# Patient Record
Sex: Female | Born: 2000 | Race: Black or African American | Hispanic: No | Marital: Single | Smoking: Never smoker
Health system: Southern US, Community
[De-identification: ages and names within clinical notes are randomized; demographics above are authoritative.]

## PROBLEM LIST (undated history)

## (undated) DIAGNOSIS — K219 Gastro-esophageal reflux disease without esophagitis: Secondary | ICD-10-CM

## (undated) DIAGNOSIS — N809 Endometriosis, unspecified: Secondary | ICD-10-CM

## (undated) HISTORY — PX: TONSILLECTOMY: SUR1361

---
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
## 2021-05-10 ENCOUNTER — Other Ambulatory Visit: Payer: Self-pay | Admitting: Physician Assistant

## 2021-05-10 DIAGNOSIS — K219 Gastro-esophageal reflux disease without esophagitis: Secondary | ICD-10-CM

## 2021-05-30 ENCOUNTER — Other Ambulatory Visit: Payer: Medicaid Other

## 2021-06-10 ENCOUNTER — Other Ambulatory Visit: Payer: Medicaid Other

## 2021-07-23 ENCOUNTER — Ambulatory Visit: Payer: Self-pay

## 2021-07-23 ENCOUNTER — Encounter (HOSPITAL_BASED_OUTPATIENT_CLINIC_OR_DEPARTMENT_OTHER): Payer: Self-pay | Admitting: *Deleted

## 2021-07-23 ENCOUNTER — Emergency Department (HOSPITAL_BASED_OUTPATIENT_CLINIC_OR_DEPARTMENT_OTHER)
Admission: EM | Admit: 2021-07-23 | Discharge: 2021-07-23 | Disposition: A | Discharge status: Home / Self Care | Payer: Managed Care, Other (non HMO) | Attending: Emergency Medicine | Admitting: Emergency Medicine

## 2021-07-23 ENCOUNTER — Other Ambulatory Visit: Payer: Self-pay

## 2021-07-23 DIAGNOSIS — R110 Nausea: Secondary | ICD-10-CM | POA: Diagnosis not present

## 2021-07-23 DIAGNOSIS — K219 Gastro-esophageal reflux disease without esophagitis: Secondary | ICD-10-CM | POA: Diagnosis not present

## 2021-07-23 DIAGNOSIS — R109 Unspecified abdominal pain: Secondary | ICD-10-CM | POA: Insufficient documentation

## 2021-07-23 DIAGNOSIS — R197 Diarrhea, unspecified: Secondary | ICD-10-CM | POA: Diagnosis present

## 2021-07-23 HISTORY — DX: Gastro-esophageal reflux disease without esophagitis: K21.9

## 2021-07-23 LAB — URINALYSIS, ROUTINE W REFLEX MICROSCOPIC
Bilirubin Urine: NEGATIVE
Glucose, UA: NEGATIVE mg/dL
Hgb urine dipstick: NEGATIVE
Ketones, ur: NEGATIVE mg/dL
Leukocytes,Ua: NEGATIVE
Nitrite: NEGATIVE
Protein, ur: NEGATIVE mg/dL
Specific Gravity, Urine: 1.02 (ref 1.005–1.030)
pH: 7 (ref 5.0–8.0)

## 2021-07-23 LAB — CBC
HCT: 40 % (ref 36.0–46.0)
Hemoglobin: 13.2 g/dL (ref 12.0–15.0)
MCH: 28.9 pg (ref 26.0–34.0)
MCHC: 33 g/dL (ref 30.0–36.0)
MCV: 87.5 fL (ref 80.0–100.0)
Platelets: 330 10*3/uL (ref 150–400)
RBC: 4.57 MIL/uL (ref 3.87–5.11)
RDW: 12.9 % (ref 11.5–15.5)
WBC: 8.9 10*3/uL (ref 4.0–10.5)
nRBC: 0 % (ref 0.0–0.2)

## 2021-07-23 LAB — COMPREHENSIVE METABOLIC PANEL
ALT: 10 U/L (ref 0–44)
AST: 14 U/L — ABNORMAL LOW (ref 15–41)
Albumin: 3.9 g/dL (ref 3.5–5.0)
Alkaline Phosphatase: 49 U/L (ref 38–126)
Anion gap: 10 (ref 5–15)
BUN: 8 mg/dL (ref 6–20)
CO2: 24 mmol/L (ref 22–32)
Calcium: 9.3 mg/dL (ref 8.9–10.3)
Chloride: 104 mmol/L (ref 98–111)
Creatinine, Ser: 0.74 mg/dL (ref 0.44–1.00)
GFR, Estimated: >60 mL/min (ref >60)
Glucose, Bld: 94 mg/dL (ref 70–99)
Potassium: 3.8 mmol/L (ref 3.5–5.1)
Sodium: 138 mmol/L (ref 135–145)
Total Bilirubin: 0.5 mg/dL (ref 0.3–1.2)
Total Protein: 7.4 g/dL (ref 6.5–8.1)

## 2021-07-23 LAB — PREGNANCY, URINE: Preg Test, Ur: NEGATIVE

## 2021-07-23 LAB — LIPASE, BLOOD: Lipase: 25 U/L (ref 11–51)

## 2021-07-23 MED ORDER — ONDANSETRON 4 MG PO TBDP: 4.0000 mg | ORAL_TABLET | Freq: Three times a day (TID) | ORAL | 0 refills | Status: DC | PRN

## 2021-07-23 MED ORDER — DICYCLOMINE HCL 20 MG PO TABS: 20.0000 mg | ORAL_TABLET | Freq: Three times a day (TID) | ORAL | 0 refills | Status: DC | PRN

## 2021-07-23 NOTE — Discharge Instructions (Addendum)
Please keep your GI appointment tomorrow.  I am calling in 2 medications to help with your symptoms.  Return to the emergency department any new or suddenly worsening symptoms such as severe pain, fevers, vomiting blood, etc.

## 2021-07-23 NOTE — ED Notes (Signed)
Patient reports intermittent abdominal pain, nausea and dizziness since starting Linzess 2 weeks ago, patient has a follow up with GI doctor tomorrow.

## 2021-07-23 NOTE — ED Provider Notes (Signed)
Emergency Department Provider Note   I have reviewed the triage vital signs and the nursing notes.   HISTORY  Chief Complaint Diarrhea   HPI Julie Hobbs is a 20 y.o. female presents to the ED with abdominal pain and diarrhea with nausea. Symptoms have been intermittent in the last year.  Patient has an appointment with her GI doctor tomorrow.  She states she was started on Linzess 2 weeks ago as a trial for possible IBS symptoms although does not carry that diagnosis.  She states that the medicine causes nausea.  She did have some bowel movements on the medication but has since developed some diarrhea.  Symptoms do seem worse after taking the medicines.  She states that while her symptoms have worsened in the last 2 weeks she verbalizes some frustration over the symptoms over the past year.  She does not have a firm diagnosis regarding this.  No fevers or chills.  No radiation of the symptoms or modifying factors.  Abdominal pain is diffuse and sharp with some pressure sensation at times.  Past Medical History:  Diagnosis Date   GERD (gastroesophageal reflux disease)     There are no problems to display for this patient.   Past Surgical History:  Procedure Laterality Date   TONSILLECTOMY      Allergies Patient has no known allergies.  No family history on file.  Social History Social History   Tobacco Use   Smoking status: Never   Smokeless tobacco: Never  Vaping Use   Vaping Use: Never used  Substance Use Topics   Alcohol use: No   Drug use: No    Review of Systems  Constitutional: No fever/chills Eyes: No visual changes. ENT: No sore throat. Cardiovascular: Denies chest pain. Respiratory: Denies shortness of breath. Gastrointestinal: Positive abdominal pain. Positive nausea, no vomiting. Positive diarrhea.  No constipation. Genitourinary: Negative for dysuria. Musculoskeletal: Negative for back pain. Skin: Negative for rash. Neurological: Negative for  headaches, focal weakness or numbness.  10-point ROS otherwise negative.  ____________________________________________   PHYSICAL EXAM:  VITAL SIGNS: ED Triage Vitals  Enc Vitals Group     BP 07/23/21 1425 134/90     Pulse Rate 07/23/21 1425 81     Resp 07/23/21 1425 18     Temp 07/23/21 1425 99.6 F (37.6 C)     Temp Source 07/23/21 1425 Oral     SpO2 07/23/21 1425 100 %     Weight 07/23/21 1426 183 lb (83 kg)     Height 07/23/21 1426 5' (1.524 m)   Constitutional: Alert and oriented. Well appearing and in no acute distress. Eyes: Conjunctivae are normal.  Head: Atraumatic. Nose: No congestion/rhinnorhea. Mouth/Throat: Mucous membranes are moist.   Neck: No stridor.  Cardiovascular: Normal rate, regular rhythm. Good peripheral circulation. Grossly normal heart sounds.   Respiratory: Normal respiratory effort.  No retractions. Lungs CTAB. Gastrointestinal: Soft and nontender. No distention.  Musculoskeletal: No lower extremity tenderness nor edema. No gross deformities of extremities. Neurologic:  Normal speech and language. No gross focal neurologic deficits are appreciated.  Skin:  Skin is warm, dry and intact. No rash noted.   ____________________________________________   LABS (all labs ordered are listed, but only abnormal results are displayed)  Labs Reviewed  COMPREHENSIVE METABOLIC PANEL - Abnormal; Notable for the following components:      Result Value   AST 14 (*)    All other components within normal limits  LIPASE, BLOOD  CBC  URINALYSIS, ROUTINE W  REFLEX MICROSCOPIC  PREGNANCY, URINE   ____________________________________________   PROCEDURES  Procedure(s) performed:   Procedures  None  ____________________________________________   INITIAL IMPRESSION / ASSESSMENT AND PLAN / ED COURSE  Pertinent labs & imaging results that were available during my care of the patient were reviewed by me and considered in my medical decision making (see  chart for details).   Patient presents to the emergency department with abdominal pain and nausea with diarrhea.  Symptoms ongoing for the past year.  Labs reviewed with no acute findings.  Pregnancy is negative.  Patient sees her GI team tomorrow.  Plan for Zofran and Bentyl for symptom management and will discuss further testing on an outpatient basis with her GI team in the morning.  I do not see an indication for emergent imaging in the emergency department.  She is not having focal tenderness in the right upper quadrant to suspect gallbladder disease.  No right lower quadrant tenderness to suspect acute appendicitis.   ____________________________________________  FINAL CLINICAL IMPRESSION(S) / ED DIAGNOSES  Final diagnoses:  Nausea  Diarrhea, unspecified type    NEW OUTPATIENT MEDICATIONS STARTED DURING THIS VISIT:  New Prescriptions   DICYCLOMINE (BENTYL) 20 MG TABLET    Take 1 tablet (20 mg total) by mouth 3 (three) times daily as needed for spasms.   ONDANSETRON (ZOFRAN ODT) 4 MG DISINTEGRATING TABLET    Take 1 tablet (4 mg total) by mouth every 8 (eight) hours as needed.    Note:  This document was prepared using Dragon voice recognition software and may include unintentional dictation errors.  Alona Bene, MD, [facility] Emergency Medicine    Abel Ra, Arlyss Repress, MD 07/23/21 [phone]

## 2021-07-23 NOTE — ED Triage Notes (Signed)
Diarrhea and nausea. She has been taking Linzess everyday for 2 weeks.

## 2021-07-23 NOTE — Telephone Encounter (Signed)
Patient called to speak to someone about a reaction to Linzess, as the agent was connecting to NT, the patient hung up. I called the patient back, left a VM to return the call if she would like to speak to a triage nurse about her symptoms.

## 2021-07-24 ENCOUNTER — Other Ambulatory Visit: Payer: Self-pay | Admitting: Physician Assistant

## 2021-07-24 DIAGNOSIS — K2100 Gastro-esophageal reflux disease with esophagitis, without bleeding: Secondary | ICD-10-CM

## 2021-07-24 DIAGNOSIS — K5900 Constipation, unspecified: Secondary | ICD-10-CM

## 2021-07-24 DIAGNOSIS — R1030 Lower abdominal pain, unspecified: Secondary | ICD-10-CM

## 2021-08-12 ENCOUNTER — Ambulatory Visit
Admission: RE | Admit: 2021-08-12 | Discharge: 2021-08-12 | Disposition: A | Discharge status: Home / Self Care | Payer: Managed Care, Other (non HMO) | Source: Ambulatory Visit | Attending: Physician Assistant | Admitting: Physician Assistant

## 2021-08-12 ENCOUNTER — Other Ambulatory Visit: Payer: Self-pay

## 2021-08-12 DIAGNOSIS — K5900 Constipation, unspecified: Secondary | ICD-10-CM

## 2021-08-12 DIAGNOSIS — R1030 Lower abdominal pain, unspecified: Secondary | ICD-10-CM

## 2021-08-12 DIAGNOSIS — K2100 Gastro-esophageal reflux disease with esophagitis, without bleeding: Secondary | ICD-10-CM

## 2021-08-12 MED ORDER — IOPAMIDOL (ISOVUE-300) INJECTION 61%
100.0000 mL | Freq: Once | INTRAVENOUS | Status: AC | PRN
  Administered 2021-08-12: 100 mL via INTRAVENOUS

## 2022-07-13 ENCOUNTER — Encounter (HOSPITAL_COMMUNITY): Payer: Self-pay

## 2022-07-13 ENCOUNTER — Emergency Department (HOSPITAL_COMMUNITY): Payer: Medicaid Other

## 2022-07-13 ENCOUNTER — Emergency Department (HOSPITAL_COMMUNITY)
Admission: EM | Admit: 2022-07-13 | Discharge: 2022-07-13 | Disposition: A | Discharge status: Home / Self Care | Payer: Medicaid Other | Attending: Emergency Medicine | Admitting: Emergency Medicine

## 2022-07-13 ENCOUNTER — Other Ambulatory Visit: Payer: Self-pay

## 2022-07-13 DIAGNOSIS — S93402A Sprain of unspecified ligament of left ankle, initial encounter: Secondary | ICD-10-CM | POA: Insufficient documentation

## 2022-07-13 DIAGNOSIS — S99912A Unspecified injury of left ankle, initial encounter: Secondary | ICD-10-CM | POA: Diagnosis present

## 2022-07-13 DIAGNOSIS — W1781XA Fall down embankment (hill), initial encounter: Secondary | ICD-10-CM | POA: Diagnosis not present

## 2022-07-13 NOTE — ED Provider Notes (Signed)
[facility]-EMERGENCY DEPT Provider Note   CSN: [id number] Arrival date & time: 07/13/22  1402     History  Chief Complaint  Patient presents with   Ankle Injury    Julie Hobbs is a 21 y.o. female noncontributory past medical history presents with concern for left ankle injury after twisting it and rolling down a hill.  Patient reports that she is not sure whether she fell onto the inside of the outside of her ankle.  She reports that she can bear weight with some difficulty but it is very difficult, she reports that she feels pain shooting up from the ankle to the mid leg.  She reports some tingling without overt numbness.  She does not take any blood thinners.  She denies hitting her head, loss consciousness.   Ankle Injury       Home Medications Prior to Admission medications   Medication Sig Start Date End Date Taking? Authorizing Provider  albuterol (PROVENTIL HFA;VENTOLIN HFA) 108 (90 Base) MCG/ACT inhaler Inhale 1-2 puffs into the lungs every 6 (six) hours as needed for wheezing or shortness of breath. 10/28/17   Ward, Layla Maw, DO  dicyclomine (BENTYL) 20 MG tablet Take 1 tablet (20 mg total) by mouth 3 (three) times daily as needed for spasms. 07/23/21   Long, Arlyss Repress, MD  linaclotide Karlene Einstein) 290 MCG CAPS capsule Linzess 290 mcg capsule  Take 1 capsule every day by oral route.    [provider]  ondansetron (ZOFRAN ODT) 4 MG disintegrating tablet Take 1 tablet (4 mg total) by mouth every 8 (eight) hours as needed. 07/23/21   Long, Arlyss Repress, MD  pantoprazole (PROTONIX) 40 MG tablet pantoprazole 40 mg tablet,delayed release    [provider]      Allergies    Patient has no known allergies.    Review of Systems   Review of Systems  All other systems reviewed and are negative.   Physical Exam Updated Vital Signs BP 129/83   Pulse 64   Temp 98.2 F (36.8 C) (Oral)   Resp 16   SpO2 100%  Physical Exam Vitals and  nursing note reviewed.  Constitutional:      General: She is not in acute distress.    Appearance: Normal appearance.  HENT:     Head: Normocephalic and atraumatic.  Eyes:     General:        Right eye: No discharge.        Left eye: No discharge.  Cardiovascular:     Rate and Rhythm: Normal rate and regular rhythm.     Pulses: Normal pulses.  Pulmonary:     Effort: Pulmonary effort is normal. No respiratory distress.  Musculoskeletal:        General: No deformity.     Comments: Patient with some fairly significant swelling of the left ankle.  She is tender to palpation on the medial and lateral malleolus.  She has intact range of motion both passively and actively to dorsiflexion, plantarflexion.  She does have some tenderness extending up into the tibia and fibula without step-off, deformity.  She can bear weight with some difficulty.  Skin:    General: Skin is warm and dry.     Capillary Refill: Capillary refill takes less than 2 seconds.  Neurological:     Mental Status: She is alert and oriented to person, place, and time.  Psychiatric:        Mood and Affect: Mood normal.  Behavior: Behavior normal.     ED Results / Procedures / Treatments   Labs (all labs ordered are listed, but only abnormal results are displayed) Labs Reviewed - No data to display  EKG None  Radiology DG Ankle Complete Left  Result Date: 07/13/2022 CLINICAL DATA:  Left ankle injury, anterior and posterior pain, numbness EXAM: LEFT ANKLE COMPLETE - 3+ VIEW COMPARISON:  10/03/2017 FINDINGS: Frontal, oblique, and lateral views of the left ankle are obtained. No acute fracture, subluxation, or dislocation. Joint spaces are well preserved. Soft tissues are unremarkable. IMPRESSION: 1. Unremarkable left ankle. Electronically Signed   By: Sharlet Salina M.D.   On: 07/13/2022 15:25    Procedures Procedures    Medications Ordered in ED Medications - No data to display  ED Course/ Medical  Decision Making/ A&P                           Medical Decision Making Amount and/or Complexity of Data Reviewed Radiology: ordered.   This an overall well-appearing 21 year old female who presents with concern for left ankle injury.  On my exam her symptoms seem most consistent with left ankle sprain.  The emergent differential diagnosis includes fracture, dislocation, high ankle sprain, other musculoskeletal injury versus other.  I independently interpreted imaging including plain film radiograph of the left which shows no acute fracture, dislocation. I agree with the radiologist interpretation.  Patient with neurovascularly intact left lower extremity, symptoms do seem most consistent with ankle sprain with some other bruising and tissue injury from her fall.  We will provide her with ASO brace, crutches, encouraged ibuprofen, Tylenol, rest, ice, compression, elevation as well as orthopedic follow-up.  Rehab exercises given.  Patient discharged in stable condition at this time. Final Clinical Impression(s) / ED Diagnoses Final diagnoses:  Sprain of left ankle, unspecified ligament, initial encounter    Rx / DC Orders ED Discharge Orders     None         Julie Floss, PA-C 07/13/22 1543    Virgina Norfolk, DO 07/13/22 1723

## 2022-07-13 NOTE — Discharge Instructions (Addendum)
Please use Tylenol or ibuprofen for pain.  You may use 600 mg ibuprofen every 6 hours or 1000 mg of Tylenol every 6 hours.  You may choose to alternate between the 2.  This would be most effective.  Not to exceed 4 g of Tylenol within 24 hours.  Not to exceed 3200 mg ibuprofen 24 hours.  Recommend rest, ice, compression, elevation in addition to the above.  You can bear weight on the ankle as tolerated, please use the rehab exercises provided above. Please follow up with orthopedics if no significant improvement in 1-2 weeks.

## 2022-07-13 NOTE — ED Triage Notes (Signed)
Pt reports she fell down a hill and injured her left ankle. Pt now endorses pain and swelling to ankle.

## 2022-07-13 NOTE — ED Notes (Signed)
Ortho called 

## 2022-10-29 ENCOUNTER — Ambulatory Visit
Admission: EM | Admit: 2022-10-29 | Discharge: 2022-10-29 | Disposition: A | Discharge status: Home / Self Care | Payer: Medicaid Other | Attending: Urgent Care | Admitting: Urgent Care

## 2022-10-29 ENCOUNTER — Encounter: Payer: Self-pay | Admitting: Emergency Medicine

## 2022-10-29 DIAGNOSIS — A09 Infectious gastroenteritis and colitis, unspecified: Secondary | ICD-10-CM | POA: Insufficient documentation

## 2022-10-29 DIAGNOSIS — R1084 Generalized abdominal pain: Secondary | ICD-10-CM | POA: Insufficient documentation

## 2022-10-29 MED ORDER — ONDANSETRON 8 MG PO TBDP: 8.0000 mg | ORAL_TABLET | Freq: Three times a day (TID) | ORAL | 0 refills | Status: DC | PRN

## 2022-10-29 MED ORDER — LOPERAMIDE HCL 2 MG PO CAPS: 2.0000 mg | ORAL_CAPSULE | Freq: Two times a day (BID) | ORAL | 0 refills | Status: AC | PRN

## 2022-10-29 NOTE — Discharge Instructions (Addendum)

## 2022-10-29 NOTE — ED Provider Notes (Signed)
[facility]  Note:  This document was prepared using Systems analyst and may include unintentional dictation errors.  MRN: [id number] DOB: January 19, 2001  Subjective:   Julie Hobbs is a 21 y.o. female presenting for 3-day history of acute onset nausea, vomiting, dizziness, diarrhea, fevers and headaches.  Patient had many bouts of vomiting initially and has had more diarrhea recently.  Hematemesis, bloody stools.  She believes that this is related to finding a decomposing rash at the bottom of caramel syrup that she was using at work.  She has been using that in her coffee.  Denies history of GI disorders.  No recent hospitalizations or long distance travel, antibiotic use.  No current facility-administered medications for this encounter.  Current Outpatient Medications:    albuterol (PROVENTIL HFA;VENTOLIN HFA) 108 (90 Base) MCG/ACT inhaler, Inhale 1-2 puffs into the lungs every 6 (six) hours as needed for wheezing or shortness of breath., Disp: 1 Inhaler, Rfl: 0   dicyclomine (BENTYL) 20 MG tablet, Take 1 tablet (20 mg total) by mouth 3 (three) times daily as needed for spasms., Disp: 20 tablet, Rfl: 0   linaclotide (LINZESS) 290 MCG CAPS capsule, Linzess 290 mcg capsule  Take 1 capsule every day by oral route., Disp: , Rfl:    ondansetron (ZOFRAN ODT) 4 MG disintegrating tablet, Take 1 tablet (4 mg total) by mouth every 8 (eight) hours as needed., Disp: 20 tablet, Rfl: 0   pantoprazole (PROTONIX) 40 MG tablet, pantoprazole 40 mg tablet,delayed release, Disp: , Rfl:    No Known Allergies  Past Medical History:  Diagnosis Date   GERD (gastroesophageal reflux disease)      Past Surgical History:  Procedure Laterality Date   TONSILLECTOMY      History reviewed. No pertinent family history.  Social History   Tobacco Use   Smoking status: Never   Smokeless tobacco: Never  Vaping Use   Vaping Use: Never used  Substance Use Topics    Alcohol use: No   Drug use: No    ROS   Objective:   Vitals: BP (!) 137/104 (BP Location: Left Arm)   Pulse 78   Temp 97.8 F (36.6 C) (Oral)   Resp 16   SpO2 98%   Physical Exam Constitutional:      General: She is not in acute distress.    Appearance: Normal appearance. She is well-developed. She is not ill-appearing, toxic-appearing or diaphoretic.  HENT:     Head: Normocephalic and atraumatic.     Nose: Nose normal.     Mouth/Throat:     Mouth: Mucous membranes are moist.  Eyes:     General: No scleral icterus.       Right eye: No discharge.        Left eye: No discharge.     Extraocular Movements: Extraocular movements intact.     Conjunctiva/sclera: Conjunctivae normal.  Cardiovascular:     Rate and Rhythm: Normal rate.  Pulmonary:     Effort: Pulmonary effort is normal.  Abdominal:     General: Bowel sounds are normal. There is no distension.     Palpations: Abdomen is soft. There is no mass.     Tenderness: There is abdominal tenderness (generalized throughout worse over the lower side). There is no right CVA tenderness, left CVA tenderness, guarding or rebound.  Skin:    General: Skin is warm and dry.  Neurological:     General: No focal deficit present.  Mental Status: She is alert and oriented to person, place, and time.  Psychiatric:        Mood and Affect: Mood normal.        Behavior: Behavior normal.        Thought Content: Thought content normal.        Judgment: Judgment normal.       Assessment and Plan :   PDMP not reviewed this encounter.  1. Infectious gastroenteritis and colitis   2. Generalized abdominal pain     Will manage for suspected infectious gastroenteritis and colitis with supportive care.  Recommended patient hydrate well, eat light meals and maintain electrolytes.  Will use Zofran and Imodium for nausea, vomiting and diarrhea.  Culture pending, will treat as appropriate based off of those results.  Counseled patient  on potential for adverse effects with medications prescribed/recommended today, ER and return-to-clinic precautions discussed, patient verbalized understanding.    Jaynee Eagles, PA-C 10/29/22 1734

## 2022-10-29 NOTE — ED Triage Notes (Signed)
Nausea, vomiting, dizziness, diarrhea, fever, headache. Found out recently at her job that a rat died in the caramel syrup, and she had been putting that in her coffee.

## 2022-11-03 ENCOUNTER — Telehealth (HOSPITAL_COMMUNITY): Payer: Self-pay | Admitting: Emergency Medicine

## 2022-11-03 MED ORDER — CIPROFLOXACIN HCL 500 MG PO TABS: 500.0000 mg | ORAL_TABLET | Freq: Two times a day (BID) | ORAL | 0 refills | Status: AC

## 2022-11-04 LAB — GASTROINTESTINAL PANEL BY PCR, STOOL (REPLACES STOOL CULTURE)

## 2022-12-04 ENCOUNTER — Ambulatory Visit (INDEPENDENT_AMBULATORY_CARE_PROVIDER_SITE_OTHER): Payer: Medicaid Other

## 2022-12-04 ENCOUNTER — Ambulatory Visit
Admission: RE | Admit: 2022-12-04 | Discharge: 2022-12-04 | Disposition: A | Discharge status: Home / Self Care | Payer: Medicaid Other | Source: Ambulatory Visit | Attending: Physician Assistant | Admitting: Physician Assistant

## 2022-12-04 VITALS — BP 120/77 | HR 70 | Temp 99.0°F | Resp 18 | Ht 60.0 in | Wt 180.0 lb

## 2022-12-04 DIAGNOSIS — R109 Unspecified abdominal pain: Secondary | ICD-10-CM

## 2022-12-04 DIAGNOSIS — R110 Nausea: Secondary | ICD-10-CM

## 2022-12-04 HISTORY — DX: Endometriosis, unspecified: N80.9

## 2022-12-04 LAB — POCT URINALYSIS DIP (MANUAL ENTRY)
Bilirubin, UA: NEGATIVE
Blood, UA: NEGATIVE
Glucose, UA: NEGATIVE mg/dL
Ketones, POC UA: NEGATIVE mg/dL
Nitrite, UA: NEGATIVE
Spec Grav, UA: >=1.03 — AB (ref 1.010–1.025)
Urobilinogen, UA: 0.2 E.U./dL
pH, UA: 6 (ref 5.0–8.0)

## 2022-12-04 LAB — POCT URINE PREGNANCY: Preg Test, Ur: NEGATIVE

## 2022-12-04 MED ORDER — PROMETHAZINE HCL 12.5 MG PO TABS: 12.5000 mg | ORAL_TABLET | Freq: Three times a day (TID) | ORAL | 0 refills | Status: AC | PRN

## 2022-12-04 MED ORDER — DICYCLOMINE HCL 20 MG PO TABS: 20.0000 mg | ORAL_TABLET | Freq: Three times a day (TID) | ORAL | 0 refills | Status: AC | PRN

## 2022-12-04 NOTE — ED Provider Notes (Signed)
[facility]    CSN: [id number] Arrival date & time: 12/04/22  4098      History   Chief Complaint Chief Complaint  Patient presents with   Chills    Woke up this morning with chills and body soreness. I get viruses often so I'm sure it's just that - Entered by patient    HPI Julie SHACKETT is a 21 y.o. female.   Patient presents today with a several hour history of left-sided abdominal pain and nausea.  Reports that she has had intermittent abdominal symptoms in the past but denies formal diagnosis of irritable bowel, Crohn's, GERD, peptic ulcer disease.  She has not tried any over-the-counter medication for symptom management.  She reports pain is minimal but is worsen with palpation.  Denies previous abdominal surgery.  She does not take any NSAIDs on a regular basis; uses ibuprofen as needed for acute pain.  Denies regular alcohol consumption.  Denies any recent medication changes and does not take any GLP-1 agonist.  She reports having normal bowel movements with last bowel movement yesterday which she describes as normal.  She denies any recent dietary or medication changes.  Denies any known sick contacts.    Past Medical History:  Diagnosis Date   Endometriosis    GERD (gastroesophageal reflux disease)     There are no problems to display for this patient.   Past Surgical History:  Procedure Laterality Date   TONSILLECTOMY      OB History   No obstetric history on file.      Home Medications    Prior to Admission medications   Medication Sig Start Date End Date Taking? Authorizing Provider  albuterol (PROVENTIL HFA;VENTOLIN HFA) 108 (90 Base) MCG/ACT inhaler Inhale 1-2 puffs into the lungs every 6 (six) hours as needed for wheezing or shortness of breath. 10/28/17  Yes Ward, Layla Maw, DO  loperamide (IMODIUM) 2 MG capsule Take 1 capsule (2 mg total) by mouth 2 (two) times daily as needed for diarrhea or loose stools. 10/29/22  Yes Wallis Bamberg, PA-C   promethazine (PHENERGAN) 12.5 MG tablet Take 1 tablet (12.5 mg total) by mouth every 8 (eight) hours as needed for nausea or vomiting. 12/04/22  Yes Rylei Codispoti K, PA-C  dicyclomine (BENTYL) 20 MG tablet Take 1 tablet (20 mg total) by mouth 3 (three) times daily as needed for spasms. 12/04/22   Aminat Shelburne, Noberto Retort, PA-C  linaclotide (LINZESS) 290 MCG CAPS capsule Linzess 290 mcg capsule  Take 1 capsule every day by oral route.    [provider]  pantoprazole (PROTONIX) 40 MG tablet pantoprazole 40 mg tablet,delayed release    [provider]    Family History History reviewed. No pertinent family history.  Social History Social History   Tobacco Use   Smoking status: Never   Smokeless tobacco: Never  Vaping Use   Vaping Use: Never used  Substance Use Topics   Alcohol use: No   Drug use: No     Allergies   Patient has no known allergies.   Review of Systems Review of Systems  Constitutional:  Positive for activity change. Negative for appetite change, fatigue and fever.  HENT:  Negative for congestion, sinus pressure, sneezing and sore throat.   Respiratory:  Negative for cough and shortness of breath.   Cardiovascular:  Negative for chest pain.  Gastrointestinal:  Positive for abdominal pain, nausea and vomiting. Negative for blood in stool, constipation and diarrhea.  Genitourinary:  Negative  for dysuria, frequency, urgency, vaginal bleeding, vaginal discharge and vaginal pain.  Neurological:  Negative for dizziness, light-headedness and headaches.     Physical Exam Triage Vital Signs ED Triage Vitals  Enc Vitals Group     BP 12/04/22 1003 120/77     Pulse Rate 12/04/22 1003 70     Resp 12/04/22 1003 18     Temp 12/04/22 1003 99 F (37.2 C)     Temp Source 12/04/22 1003 Oral     SpO2 12/04/22 1003 99 %     Weight 12/04/22 1005 180 lb (81.6 kg)     Height 12/04/22 1005 5' (1.524 m)     Head Circumference --      Peak Flow --      Pain Score  12/04/22 1005 0     Pain Loc --      Pain Edu? --      Excl. in GC? --    No data found.  Updated Vital Signs BP 120/77 (BP Location: Left Arm)   Pulse 70   Temp 99 F (37.2 C) (Oral)   Resp 18   Ht 5' (1.524 m)   Wt 180 lb (81.6 kg)   LMP 11/20/2022   SpO2 99%   BMI 35.15 kg/m   Visual Acuity Right Eye Distance:   Left Eye Distance:   Bilateral Distance:    Right Eye Near:   Left Eye Near:    Bilateral Near:     Physical Exam Vitals reviewed.  Constitutional:      General: She is awake. She is not in acute distress.    Appearance: Normal appearance. She is well-developed. She is not ill-appearing.     Comments: Very pleasant female appears stated age in no acute distress sitting comfortably in exam room  HENT:     Head: Normocephalic and atraumatic.  Cardiovascular:     Rate and Rhythm: Normal rate and regular rhythm.     Heart sounds: Normal heart sounds, S1 normal and S2 normal. No murmur heard. Pulmonary:     Effort: Pulmonary effort is normal.     Breath sounds: Normal breath sounds. No wheezing, rhonchi or rales.     Comments: Clear to auscultation bilaterally Abdominal:     General: Bowel sounds are normal.     Palpations: Abdomen is soft.     Tenderness: There is abdominal tenderness in the left upper quadrant and left lower quadrant. There is no right CVA tenderness, left CVA tenderness, guarding or rebound. Negative signs include Murphy's sign, Rovsing's sign, McBurney's sign and psoas sign.     Comments: Tenderness palpation over left abdomen.  No evidence of acute abdomen on physical exam.  Psychiatric:        Behavior: Behavior is cooperative.      UC Treatments / Results  Labs (all labs ordered are listed, but only abnormal results are displayed) Labs Reviewed  POCT URINALYSIS DIP (MANUAL ENTRY) - Abnormal; Notable for the following components:      Result Value   Color, UA other (*)    Spec Grav, UA >=1.030 (*)    Protein Ur, POC trace (*)     Leukocytes, UA Small (1+) (*)    All other components within normal limits  URINE CULTURE  POCT URINE PREGNANCY    EKG   Radiology DG Abdomen 1 View  Result Date: 12/04/2022 CLINICAL DATA:  Abdominal pain, nausea EXAM: ABDOMEN - 1 VIEW COMPARISON:  Julie Hobbs Available. FINDINGS: Bowel gas pattern  is nonspecific. No abnormal masses or calcifications are noted. Kidneys are partly obscured by bowel contents. Small calcific densities in both sides of pelvis may suggest phleboliths. If there is clinical suspicion for ureteric calculus, follow-up CT may be considered. IMPRESSION: Nonspecific bowel gas pattern. There are few small calcific densities in both sides of pelvis which may suggest vascular calcifications. If there is clinical suspicion for ureteric calculus, follow-up noncontrast CT may be considered. Electronically Signed   By: Ernie Avena M.D.   On: 12/04/2022 11:14    Procedures Procedures (including critical care time)  Medications Ordered in UC Medications - No data to display  Initial Impression / Assessment and Plan / UC Course  I have reviewed the triage vital signs and the nursing notes.  Pertinent labs & imaging results that were available during my care of the patient were reviewed by me and considered in my medical decision making (see chart for details).     Patient is well-appearing, afebrile, nontoxic, nontachycardic.  Vital signs and physical exam reassuring today; no indication for emergent evaluation or imaging.  Urine pregnancy was negative.  UA showed concentrated urine with trace leukocytes but patient denies any significant urinary symptoms.  Will send this for culture but defer antibiotics for the time being.  KUB was obtained that showed nonspecific bowel gas pattern as well as likely phleboliths which is unlikely to be contributing to patient's pain.  Will try Bentyl to help manage her symptoms and this was sent to the pharmacy.  She was instructed to use  bland diet and drink plenty of fluid to help manage her pain.  She has had worsening symptoms with Zofran in the past so we will try promethazine.  Discussed that this is sedating and she should not drive or drink alcohol with taking it.  Discussed that ultimately she will need to see a GI specialist and was given contact information for local provider with instruction to call to schedule appointment soon as possible.  Discussed that if she has any worsening or changing symptoms she needs to be seen immediately including severe abdominal pain, fever, nausea/vomiting interfere with oral intake, melena, hematochezia, hematemesis.  Strict return precautions given.  Work excuse note provided.  Final Clinical Impressions(s) / UC Diagnoses   Final diagnoses:  Left sided abdominal pain  Nausea without vomiting     Discharge Instructions      Your urine is concentrated so please push fluids.  Eat a bland diet.  Use Bentyl to help with your abdominal pain.  Try Promethazine for nausea.  This will make you sleepy so do not drive or drink alcohol with taking it.  It is for importantly follow-up with a GI specialist.  Please call them to schedule an appointment.  If you have any worsening abdominal pain, nausea/vomiting interfere with oral intake, weakness, blood in your stool, blood in your vomit you need to go to the emergency room immediately.     ED Prescriptions     Medication Sig Dispense Auth. Provider   dicyclomine (BENTYL) 20 MG tablet Take 1 tablet (20 mg total) by mouth 3 (three) times daily as needed for spasms. 20 tablet Sherlon Nied K, PA-C   promethazine (PHENERGAN) 12.5 MG tablet Take 1 tablet (12.5 mg total) by mouth every 8 (eight) hours as needed for nausea or vomiting. 15 tablet Vasilia Dise, Noberto Retort, PA-C      PDMP not reviewed this encounter.   Jeani Hawking, PA-C 12/04/22 1126

## 2022-12-04 NOTE — ED Triage Notes (Signed)
Patient c/o nausea, body chills, some abdominal pain and headache this am.  Patient denies taken any OTC meds.

## 2022-12-04 NOTE — Discharge Instructions (Addendum)
Your urine is concentrated so please push fluids.  Eat a bland diet.  Use Bentyl to help with your abdominal pain.  Try Promethazine for nausea.  This will make you sleepy so do not drive or drink alcohol with taking it.  It is for importantly follow-up with a GI specialist.  Please call them to schedule an appointment.  If you have any worsening abdominal pain, nausea/vomiting interfere with oral intake, weakness, blood in your stool, blood in your vomit you need to go to the emergency room immediately.

## 2022-12-05 ENCOUNTER — Telehealth: Payer: Self-pay | Admitting: Emergency Medicine

## 2022-12-05 LAB — URINE CULTURE

## 2022-12-05 NOTE — Telephone Encounter (Signed)
LMTRC.  Advised if doing well to disregard the call.  Any questions or concerns, don't hesitate to contact the office. 

## 2023-11-27 ENCOUNTER — Emergency Department (HOSPITAL_BASED_OUTPATIENT_CLINIC_OR_DEPARTMENT_OTHER): Payer: Medicaid Other

## 2023-11-27 ENCOUNTER — Emergency Department (HOSPITAL_BASED_OUTPATIENT_CLINIC_OR_DEPARTMENT_OTHER)
Admission: EM | Admit: 2023-11-27 | Discharge: 2023-11-27 | Disposition: A | Discharge status: Home / Self Care | Payer: Medicaid Other | Attending: Emergency Medicine | Admitting: Emergency Medicine

## 2023-11-27 ENCOUNTER — Other Ambulatory Visit: Payer: Self-pay

## 2023-11-27 DIAGNOSIS — R1031 Right lower quadrant pain: Secondary | ICD-10-CM | POA: Diagnosis present

## 2023-11-27 DIAGNOSIS — A084 Viral intestinal infection, unspecified: Secondary | ICD-10-CM | POA: Diagnosis not present

## 2023-11-27 DIAGNOSIS — Z20822 Contact with and (suspected) exposure to covid-19: Secondary | ICD-10-CM | POA: Diagnosis not present

## 2023-11-27 DIAGNOSIS — R109 Unspecified abdominal pain: Secondary | ICD-10-CM

## 2023-11-27 LAB — I-STAT CHEM 8, ED
BUN: 6 mg/dL (ref 6–20)
Calcium, Ion: 1.21 mmol/L (ref 1.15–1.40)
Chloride: 105 mmol/L (ref 98–111)
Creatinine, Ser: 0.8 mg/dL (ref 0.44–1.00)
Glucose, Bld: 75 mg/dL (ref 70–99)
HCT: 41 % (ref 36.0–46.0)
Hemoglobin: 13.9 g/dL (ref 12.0–15.0)
Potassium: 4 mmol/L (ref 3.5–5.1)
Sodium: 140 mmol/L (ref 135–145)
TCO2: 23 mmol/L (ref 22–32)

## 2023-11-27 LAB — RESP PANEL BY RT-PCR (RSV, FLU A&B, COVID)  RVPGX2
Influenza A by PCR: NEGATIVE
Influenza B by PCR: NEGATIVE
Resp Syncytial Virus by PCR: NEGATIVE
SARS Coronavirus 2 by RT PCR: NEGATIVE

## 2023-11-27 LAB — CBC
HCT: 40.3 % (ref 36.0–46.0)
Hemoglobin: 13.2 g/dL (ref 12.0–15.0)
MCH: 28.9 pg (ref 26.0–34.0)
MCHC: 32.8 g/dL (ref 30.0–36.0)
MCV: 88.4 fL (ref 80.0–100.0)
Platelets: 348 10*3/uL (ref 150–400)
RBC: 4.56 MIL/uL (ref 3.87–5.11)
RDW: 13.2 % (ref 11.5–15.5)
WBC: 12.9 10*3/uL — ABNORMAL HIGH (ref 4.0–10.5)
nRBC: 0 % (ref 0.0–0.2)

## 2023-11-27 LAB — URINALYSIS, ROUTINE W REFLEX MICROSCOPIC
Bilirubin Urine: NEGATIVE
Glucose, UA: NEGATIVE mg/dL
Hgb urine dipstick: NEGATIVE
Ketones, ur: NEGATIVE mg/dL
Nitrite: NEGATIVE
Protein, ur: NEGATIVE mg/dL
Specific Gravity, Urine: >=1.03 (ref 1.005–1.030)
pH: 7 (ref 5.0–8.0)

## 2023-11-27 LAB — URINALYSIS, MICROSCOPIC (REFLEX)

## 2023-11-27 LAB — LIPASE, BLOOD: Lipase: 26 U/L (ref 11–51)

## 2023-11-27 LAB — PREGNANCY, URINE: Preg Test, Ur: NEGATIVE

## 2023-11-27 MED ORDER — MORPHINE SULFATE (PF) 4 MG/ML IV SOLN
4.0000 mg | Freq: Once | INTRAVENOUS | Status: AC
  Administered 2023-11-27: 4 mg via INTRAVENOUS
  Filled 2023-11-27: qty 1

## 2023-11-27 MED ORDER — ONDANSETRON HCL 4 MG PO TABS
4.0000 mg | ORAL_TABLET | Freq: Four times a day (QID) | ORAL | 0 refills | Status: AC
Start: 2023-11-27 — End: ?

## 2023-11-27 MED ORDER — ONDANSETRON 4 MG PO TBDP
4.0000 mg | ORAL_TABLET | Freq: Once | ORAL | Status: AC | PRN
  Administered 2023-11-27: 4 mg via ORAL
  Filled 2023-11-27: qty 1

## 2023-11-27 MED ORDER — IOHEXOL 300 MG/ML  SOLN
100.0000 mL | Freq: Once | INTRAMUSCULAR | Status: AC | PRN
  Administered 2023-11-27: 100 mL via INTRAVENOUS

## 2023-11-27 NOTE — Discharge Instructions (Signed)
You were seen in the emergency department for abdominal pain nausea and vomiting The CAT scan taken did not show any evidence of acute appendicitis or other findings that explain your abdominal pain Your urine test did not show any evidence of a UTI Your pregnancy test is negative The rest of your labs looked okay We are not certain what is causing your symptoms but it may be related to an early viral illness We have called in a prescription for Zofran for you to pick up your pharmacy begin taking as directed as needed for nausea and vomiting You can take Tylenol or Motrin as directed for discomfort Follow-up with your primary care doctor in 1 week for reevaluation Return to the emergency department for severe pain, if you are unable to eat or drink or if you have any other concerns

## 2023-11-27 NOTE — ED Provider Notes (Signed)
[facility] [facility] Provider Note   CSN: [id number] Arrival date & time: 11/27/23  1846     History  Chief Complaint  Patient presents with   Abdominal Pain    Julie Hobbs is a 22 y.o. female.  With no significant past medical history presented to ED for abdominal pain.  Acute on onset periumbilical abdominal pain with nausea and vomiting beginning at noon today persisting since the onset.  Couple episodes of diarrhea as well.  Shaking chills at home.  Was seen earlier today at urgent care and directed here for further evaluation.  No urinary symptoms vaginal bleeding or vaginal discharge.  Recently treated for yeast infection.  No prior history of abdominal surgeries.   Abdominal Pain      Home Medications Prior to Admission medications   Medication Sig Start Date End Date Taking? Authorizing Provider  ondansetron (ZOFRAN) 4 MG tablet Take 1 tablet (4 mg total) by mouth every 6 (six) hours. 11/27/23  Yes Estelle June A, DO  albuterol (PROVENTIL HFA;VENTOLIN HFA) 108 (90 Base) MCG/ACT inhaler Inhale 1-2 puffs into the lungs every 6 (six) hours as needed for wheezing or shortness of breath. 10/28/17   Ward, Layla Maw, DO  dicyclomine (BENTYL) 20 MG tablet Take 1 tablet (20 mg total) by mouth 3 (three) times daily as needed for spasms. 12/04/22   Raspet, Noberto Retort, PA-C  linaclotide (LINZESS) 290 MCG CAPS capsule Linzess 290 mcg capsule  Take 1 capsule every day by oral route.    [provider]  loperamide (IMODIUM) 2 MG capsule Take 1 capsule (2 mg total) by mouth 2 (two) times daily as needed for diarrhea or loose stools. 10/29/22   Wallis Bamberg, PA-C  pantoprazole (PROTONIX) 40 MG tablet pantoprazole 40 mg tablet,delayed release    [provider]  promethazine (PHENERGAN) 12.5 MG tablet Take 1 tablet (12.5 mg total) by mouth every 8 (eight) hours as needed for nausea or vomiting. 12/04/22   Raspet, Noberto Retort, PA-C       Allergies    Patient has no known allergies.    Review of Systems   Review of Systems  Gastrointestinal:  Positive for abdominal pain.    Physical Exam Updated Vital Signs BP 132/79   Pulse 88   Temp 98.4 F (36.9 C) (Oral)   Resp (!) 24   SpO2 99%  Physical Exam Vitals and nursing note reviewed.  HENT:     Head: Normocephalic and atraumatic.  Eyes:     Pupils: Pupils are equal, round, and reactive to light.  Cardiovascular:     Rate and Rhythm: Normal rate and regular rhythm.  Pulmonary:     Effort: Pulmonary effort is normal.     Breath sounds: Normal breath sounds.  Abdominal:     Palpations: Abdomen is soft.     Tenderness: There is abdominal tenderness in the right lower quadrant and epigastric area. Positive signs include McBurney's sign.  Skin:    General: Skin is warm and dry.  Neurological:     Mental Status: She is alert.  Psychiatric:        Mood and Affect: Mood normal.     ED Results / Procedures / Treatments   Labs (all labs ordered are listed, but only abnormal results are displayed) Labs Reviewed  CBC - Abnormal; Notable for the following components:      Result Value   WBC 12.9 (*)    All other components  within normal limits  URINALYSIS, ROUTINE W REFLEX MICROSCOPIC - Abnormal; Notable for the following components:   Leukocytes,Ua SMALL (*)    All other components within normal limits  URINALYSIS, MICROSCOPIC (REFLEX) - Abnormal; Notable for the following components:   Bacteria, UA FEW (*)    All other components within normal limits  RESP PANEL BY RT-PCR (RSV, FLU A&B, COVID)  RVPGX2  LIPASE, BLOOD  PREGNANCY, URINE  I-STAT CHEM 8, ED    EKG None  Radiology CT ABDOMEN PELVIS W CONTRAST  Result Date: 11/27/2023 CLINICAL DATA:  RLQ abdominal pain RLQ pain ?appy EXAM: CT ABDOMEN AND PELVIS WITH CONTRAST TECHNIQUE: Multidetector CT imaging of the abdomen and pelvis was performed using the standard protocol following bolus  administration of intravenous contrast. RADIATION DOSE REDUCTION: This exam was performed according to the departmental dose-optimization program which includes automated exposure control, adjustment of the mA and/or kV according to patient size and/or use of iterative reconstruction technique. CONTRAST:  OMNIPAQUE IOHEXOL 300 MG/ML  SOLN COMPARISON:  CT enterography 08/12/2021 FINDINGS: Lower chest: No acute abnormality. Hepatobiliary: Vague hypodensity along the falciform ligament likely focal fatty infiltration. No focal liver abnormality. No gallstones, gallbladder wall thickening, or pericholecystic fluid. No biliary dilatation. Pancreas: No focal lesion. Normal pancreatic contour. No surrounding inflammatory changes. No main pancreatic ductal dilatation. Spleen: Normal in size without focal abnormality. Adrenals/Urinary Tract: No adrenal nodule bilaterally. Bilateral kidneys enhance symmetrically. No hydronephrosis. No hydroureter. The urinary bladder is unremarkable. Stomach/Bowel: Stomach is within normal limits. No evidence of bowel wall thickening or dilatation. Appendix appears normal. Vascular/Lymphatic: No abdominal aorta or iliac aneurysm. No abdominal, pelvic, or inguinal lymphadenopathy. Reproductive: Uterus and bilateral adnexa are unremarkable. Other: No intraperitoneal free fluid. No intraperitoneal free gas. No organized fluid collection. Musculoskeletal: No abdominal wall hernia or abnormality. No suspicious lytic or blastic osseous lesions. No acute displaced fracture. IMPRESSION: No acute intra-abdominal or intrapelvic abnormality. Electronically Signed   By: Tish Frederickson M.D.   On: 11/27/2023 20:30    Procedures Procedures    Medications Ordered in ED Medications  ondansetron (ZOFRAN-ODT) disintegrating tablet 4 mg (4 mg Oral Given 11/27/23 1903)  morphine (PF) 4 MG/ML injection 4 mg (4 mg Intravenous Given 11/27/23 2005)  iohexol (OMNIPAQUE) 300 MG/ML solution 100 mL (100  mLs Intravenous Contrast Given 11/27/23 2010)    ED Course/ Medical Decision Making/ A&P Clinical Course as of 11/27/23 2048  Fri Nov 27, 2023  2040 No appendicitis or other acute intra-abdominal process seen on CT.  No UTI on UA.  Slight leukocytosis of 12.9 may be reactive in the setting of acute vomiting.  Pregnancy test is negative.  Lipase within normal limits.  Metabolic panel is unremarkable.  Unclear cause of her cute onset abdominal pain nausea and vomiting with the most likely being viral gastroenteritis.  Reassessed patient.  She reports improvement in the pain and nausea after morphine and Zofran here.  Will send her home with some Zofran for symptomatic management of nausea and vomiting and instruct for Tylenol Motrin for pain control.  She will follow-up with her primary care doctor early next week for reevaluation [MP]    Clinical Course User Index [MP] Royanne Foots, DO                                 Medical Decision Making Otherwise healthy 22 year old female presenting for acute onset abdominal pain nausea vomiting beginning at  noon.  No urinary symptoms or vaginal bleeding.  Pregnancy test negative.  Afebrile and normotensive.  She is in significant discomfort at rest.  Exam notable for right lower quadrant epigastric tenderness.  Differential diagnosis includes: Acute intra-abdominal infectious/inflammatory process such as appendicitis, diverticulitis, pancreatitis and cholecystitis Endometriosis  Ovarian cyst Urinary tract infection Viral gastroenteritis  Will obtain laboratory workup including CBC with differential, metabolic panel, lipase and urinalysis along with CT abdomen pelvis with IV contrast  Zofran for nausea and vomiting control and morphine for pain  Amount and/or Complexity of Data Reviewed Labs: ordered. Radiology: ordered.  Risk Prescription drug management.           Final Clinical Impression(s) / ED Diagnoses Final diagnoses:   Abdominal pain, unspecified abdominal location  Viral gastroenteritis    Rx / DC Orders ED Discharge Orders          Ordered    ondansetron (ZOFRAN) 4 MG tablet  Every 6 hours        11/27/23 2043              Royanne Foots, DO 11/27/23 2048

## 2023-11-27 NOTE — ED Notes (Signed)
Patient transported to CT 

## 2023-11-27 NOTE — ED Triage Notes (Signed)
PtPOV with parents- c/o abd pain, emesis, diarrhea, chest tightness, symptomatic fever starting today. Took Pepto at home without relief.

## 2024-04-07 ENCOUNTER — Emergency Department (HOSPITAL_BASED_OUTPATIENT_CLINIC_OR_DEPARTMENT_OTHER)
Admission: EM | Admit: 2024-04-07 | Discharge: 2024-04-07 | Discharge status: Against Medical Advice | Attending: Emergency Medicine | Admitting: Emergency Medicine

## 2024-04-07 ENCOUNTER — Other Ambulatory Visit: Payer: Self-pay

## 2024-04-07 DIAGNOSIS — Z5329 Procedure and treatment not carried out because of patient's decision for other reasons: Secondary | ICD-10-CM | POA: Insufficient documentation

## 2024-04-07 DIAGNOSIS — R112 Nausea with vomiting, unspecified: Secondary | ICD-10-CM | POA: Insufficient documentation

## 2024-04-07 DIAGNOSIS — J45909 Unspecified asthma, uncomplicated: Secondary | ICD-10-CM | POA: Diagnosis not present

## 2024-04-07 DIAGNOSIS — D72829 Elevated white blood cell count, unspecified: Secondary | ICD-10-CM | POA: Insufficient documentation

## 2024-04-07 LAB — URINALYSIS, ROUTINE W REFLEX MICROSCOPIC
Bilirubin Urine: NEGATIVE
Glucose, UA: NEGATIVE mg/dL
Hgb urine dipstick: NEGATIVE
Ketones, ur: NEGATIVE mg/dL
Leukocytes,Ua: NEGATIVE
Nitrite: NEGATIVE
Protein, ur: >=300 mg/dL — AB
Specific Gravity, Urine: 1.025 (ref 1.005–1.030)
pH: 7.5 (ref 5.0–8.0)

## 2024-04-07 LAB — CBC
HCT: 40.7 % (ref 36.0–46.0)
Hemoglobin: 13.7 g/dL (ref 12.0–15.0)
MCH: 29.7 pg (ref 26.0–34.0)
MCHC: 33.7 g/dL (ref 30.0–36.0)
MCV: 88.1 fL (ref 80.0–100.0)
Platelets: 352 10*3/uL (ref 150–400)
RBC: 4.62 MIL/uL (ref 3.87–5.11)
RDW: 13.5 % (ref 11.5–15.5)
WBC: 11.2 10*3/uL — ABNORMAL HIGH (ref 4.0–10.5)
nRBC: 0 % (ref 0.0–0.2)

## 2024-04-07 LAB — COMPREHENSIVE METABOLIC PANEL WITH GFR
ALT: 34 U/L (ref 0–44)
AST: 28 U/L (ref 15–41)
Albumin: 4 g/dL (ref 3.5–5.0)
Alkaline Phosphatase: 50 U/L (ref 38–126)
Anion gap: 10 (ref 5–15)
BUN: 5 mg/dL — ABNORMAL LOW (ref 6–20)
CO2: 23 mmol/L (ref 22–32)
Calcium: 9.2 mg/dL (ref 8.9–10.3)
Chloride: 102 mmol/L (ref 98–111)
Creatinine, Ser: 0.67 mg/dL (ref 0.44–1.00)
GFR, Estimated: >60 mL/min (ref >60)
Glucose, Bld: 87 mg/dL (ref 70–99)
Potassium: 3.7 mmol/L (ref 3.5–5.1)
Sodium: 135 mmol/L (ref 135–145)
Total Bilirubin: 0.9 mg/dL (ref 0.0–1.2)
Total Protein: 7.7 g/dL (ref 6.5–8.1)

## 2024-04-07 LAB — URINALYSIS, MICROSCOPIC (REFLEX): RBC / HPF: NONE SEEN RBC/hpf (ref 0–5)

## 2024-04-07 LAB — LIPASE, BLOOD: Lipase: 23 U/L (ref 11–51)

## 2024-04-07 LAB — PREGNANCY, URINE: Preg Test, Ur: NEGATIVE

## 2024-04-07 MED ORDER — KETOROLAC TROMETHAMINE 30 MG/ML IJ SOLN
30.0000 mg | Freq: Once | INTRAMUSCULAR | Status: AC
  Administered 2024-04-07: 30 mg via INTRAVENOUS
  Filled 2024-04-07: qty 1

## 2024-04-07 MED ORDER — SODIUM CHLORIDE 0.9 % IV BOLUS
1000.0000 mL | Freq: Once | INTRAVENOUS | Status: AC
Start: 2024-04-07 — End: 2024-04-07
  Administered 2024-04-07: 1000 mL via INTRAVENOUS

## 2024-04-07 MED ORDER — ONDANSETRON HCL 4 MG/2ML IJ SOLN
4.0000 mg | Freq: Once | INTRAMUSCULAR | Status: AC
  Administered 2024-04-07: 4 mg via INTRAVENOUS
  Filled 2024-04-07: qty 2

## 2024-04-07 NOTE — ED Notes (Signed)
 PT resting comfortably. Warm blanket offered but declined. No needs at this time.

## 2024-04-07 NOTE — ED Triage Notes (Signed)
 Pt. States she can't keep and liquids or food down. N/V/D started yesterday morning. Also having numbness in bilat feet. States been taking semaglutide over a year and recently increased dose last month.  Pt. Also having Shob and abd pain

## 2024-04-07 NOTE — ED Provider Notes (Signed)
 [facility] Provider Note   CSN: [id number] Arrival date & time: 04/07/24  0009     History  Chief Complaint  Patient presents with   Emesis    Julie Hobbs is a 23 y.o. female.  Patient is a 23 year old female with past medical history of asthma.  Patient presenting today for evaluation of nausea and vomiting.  This started this morning shortly after she woke up.  She states that she has been unable to keep anything down all day.  She has had Zofran at home from a previous visit that is not helping.  She denies any fevers or chills.  No abdominal pain.  Patient does describe some numbness to both legs, but no weakness.  She describes this as a "pins-and-needles sensation".       Home Medications Prior to Admission medications   Medication Sig Start Date End Date Taking? Authorizing Provider  albuterol (PROVENTIL HFA;VENTOLIN HFA) 108 (90 Base) MCG/ACT inhaler Inhale 1-2 puffs into the lungs every 6 (six) hours as needed for wheezing or shortness of breath. 10/28/17   Ward, Layla Maw, DO  dicyclomine (BENTYL) 20 MG tablet Take 1 tablet (20 mg total) by mouth 3 (three) times daily as needed for spasms. 12/04/22   Raspet, Noberto Retort, PA-C  linaclotide (LINZESS) 290 MCG CAPS capsule Linzess 290 mcg capsule  Take 1 capsule every day by oral route.    [provider]  loperamide (IMODIUM) 2 MG capsule Take 1 capsule (2 mg total) by mouth 2 (two) times daily as needed for diarrhea or loose stools. 10/29/22   Wallis Bamberg, PA-C  ondansetron (ZOFRAN) 4 MG tablet Take 1 tablet (4 mg total) by mouth every 6 (six) hours. 11/27/23   Royanne Foots, DO  pantoprazole (PROTONIX) 40 MG tablet pantoprazole 40 mg tablet,delayed release    [provider]  promethazine (PHENERGAN) 12.5 MG tablet Take 1 tablet (12.5 mg total) by mouth every 8 (eight) hours as needed for nausea or vomiting. 12/04/22   Raspet, Noberto Retort, PA-C      Allergies     Patient has no known allergies.    Review of Systems   Review of Systems  All other systems reviewed and are negative.   Physical Exam Updated Vital Signs BP (!) 135/98 (BP Location: Left Arm)   Pulse 94   Temp (!) 97.5 F (36.4 C) (Oral)   Resp 18   SpO2 100%  Physical Exam Vitals and nursing note reviewed.  Constitutional:      General: She is not in acute distress.    Appearance: She is well-developed. She is not diaphoretic.  HENT:     Head: Normocephalic and atraumatic.  Cardiovascular:     Rate and Rhythm: Normal rate and regular rhythm.     Heart sounds: No murmur heard.    No friction rub. No gallop.  Pulmonary:     Effort: Pulmonary effort is normal. No respiratory distress.     Breath sounds: Normal breath sounds. No wheezing.  Abdominal:     General: Bowel sounds are normal. There is no distension.     Palpations: Abdomen is soft.     Tenderness: There is no abdominal tenderness.  Musculoskeletal:        General: Normal range of motion.     Cervical back: Normal range of motion and neck supple.  Skin:    General: Skin is warm and dry.  Neurological:  General: No focal deficit present.     Mental Status: She is alert and oriented to person, place, and time.     ED Results / Procedures / Treatments   Labs (all labs ordered are listed, but only abnormal results are displayed) Labs Reviewed  COMPREHENSIVE METABOLIC PANEL WITH GFR - Abnormal; Notable for the following components:      Result Value   BUN 5 (*)    All other components within normal limits  CBC - Abnormal; Notable for the following components:   WBC 11.2 (*)    All other components within normal limits  URINALYSIS, ROUTINE W REFLEX MICROSCOPIC - Abnormal; Notable for the following components:   Protein, ur >=300 (*)    All other components within normal limits  URINALYSIS, MICROSCOPIC (REFLEX) - Abnormal; Notable for the following components:   Bacteria, UA FEW (*)    All other  components within normal limits  LIPASE, BLOOD  PREGNANCY, URINE    EKG None  Radiology No results found.  Procedures Procedures    Medications Ordered in ED Medications  ondansetron (ZOFRAN) injection 4 mg (has no administration in time range)  ketorolac (TORADOL) 30 MG/ML injection 30 mg (has no administration in time range)  sodium chloride 0.9 % bolus 1,000 mL (has no administration in time range)    ED Course/ Medical Decision Making/ A&P  Patient is a 23 year old female presenting with complaints of nausea and vomiting as described in the HPI.  Patient arriving with stable vital signs and is afebrile.  She is clinically well-appearing and abdomen is benign.  Laboratory studies obtained including CBC, CMP, and lipase, all of which are basically unremarkable.  She does have a mild leukocytosis with white count of 11.2.  Urinalysis is clear and pregnancy test is negative.  Patient hydrated with normal saline and was given IV Zofran and Toradol.  Prior to my reassessment, I was informed that the patient had left the department without informing the nursing staff.  Final Clinical Impression(s) / ED Diagnoses Final diagnoses:  None    Rx / DC Orders ED Discharge Orders     None         Geoffery Lyons, MD 04/07/24 [phone]

## 2024-04-07 NOTE — ED Notes (Signed)
 Pt eloped from room. Took our her own IV.
